# Patient Record
Sex: Male | Born: 1960 | Race: White | Hispanic: No | Marital: Married | Smoking: Current every day smoker
Health system: Southern US, Community
[De-identification: ages and names within clinical notes are randomized; demographics above are authoritative.]

## PROBLEM LIST (undated history)

## (undated) DIAGNOSIS — E669 Obesity, unspecified: Secondary | ICD-10-CM

## (undated) HISTORY — PX: ANKLE SURGERY: SHX546

## (undated) HISTORY — PX: HERNIA REPAIR: SHX51

## (undated) HISTORY — PX: SHOULDER SURGERY: SHX246

---

## 2013-10-29 ENCOUNTER — Ambulatory Visit: Payer: Self-pay | Admitting: Gastroenterology

## 2013-10-31 LAB — PATHOLOGY REPORT

## 2016-10-24 ENCOUNTER — Ambulatory Visit
Admission: EM | Admit: 2016-10-24 | Discharge: 2016-10-24 | Disposition: A | Discharge status: Home / Self Care | Payer: BLUE CROSS/BLUE SHIELD | Attending: Family Medicine | Admitting: Family Medicine

## 2016-10-24 ENCOUNTER — Ambulatory Visit (INDEPENDENT_AMBULATORY_CARE_PROVIDER_SITE_OTHER): Payer: BLUE CROSS/BLUE SHIELD

## 2016-10-24 ENCOUNTER — Encounter: Payer: Self-pay | Admitting: *Deleted

## 2016-10-24 DIAGNOSIS — S9032XA Contusion of left foot, initial encounter: Secondary | ICD-10-CM

## 2016-10-24 NOTE — ED Triage Notes (Signed)
Pt was cutting a tree Saturday and had a tree fall on his left foot. Left foot now painful and edematous. He has been icing and elevating left foot with improvement in swelling but pain continues.

## 2016-10-24 NOTE — ED Provider Notes (Signed)
CSN: [id number]     Arrival date & time 10/24/16  0850 History   First MD Initiated Contact with Patient 10/24/16 [phone]     Chief Complaint  Patient presents with  . Foot Pain   (Consider location/radiation/quality/duration/timing/severity/associated sxs/prior Treatment) HPI  56 year old male who was cutting down a tree on Saturday 2 days prior to this visit when a tree fell on his left foot when it inadvertently shifted. These fortunately he was on soft ground. Since that time his left foot is now painful and edematous with most of his pain over the mid forefoot. Icing and elevating it with some improvement in his swelling but the pain continues.      History reviewed. No pertinent past medical history. History reviewed. No pertinent surgical history. Family History  Problem Relation Age of Onset  . Hypertension Mother   . Hypertension Father   . CAD Father    Social History  Substance Use Topics  . Smoking status: Current Every Day Smoker  . Smokeless tobacco: Never Used  . Alcohol use Yes    Review of Systems  Constitutional: Positive for activity change. Negative for chills, fatigue and fever.  Musculoskeletal: Positive for gait problem and joint swelling.  All other systems reviewed and are negative.   Allergies  Patient has no known allergies.  Home Medications   Prior to Admission medications   Not on File   Meds Ordered and Administered this Visit  Medications - No data to display  BP (!) 152/79 (BP Location: Left Arm)   Pulse 79   Temp 98 F (36.7 C) (Oral)   Resp 16   Ht 6' (1.829 m)   Wt 295 lb (133.8 kg)   SpO2 98%   BMI 40.01 kg/m  No data found.   Physical Exam  Constitutional: He appears well-developed and well-nourished. No distress.  HENT:  Head: Normocephalic and atraumatic.  Eyes: Pupils are equal, round, and reactive to light.  Neck: Normal range of motion. Neck supple.  Musculoskeletal:  Admission of left foot shows a swelling of  the fourth dorsum. Maximum tenderness is over the third and fourth metatarsals shafts. Junction of the distal and middle thirds. There is no pitting edema present.  Skin: He is not diaphoretic.  Nursing note and vitals reviewed.   Urgent Care Course     Procedures (including critical care time)  Labs Review Labs Reviewed - No data to display  Imaging Review Dg Foot Complete Left  Result Date: 10/24/2016 CLINICAL DATA:  Crush injury to LEFT foot while cutting trees, pain at top of LEFT foot toward 2nd-4th MTP joints EXAM: LEFT FOOT - COMPLETE 3+ VIEW COMPARISON:  None FINDINGS: Osseous mineralization normal. Dorsal soft tissue swelling overlying the distal metatarsal level. Small plantar calcaneal spur. No acute fracture, dislocation, or bone destruction. IMPRESSION: No acute osseous abnormalities. Electronically Signed   By: Ulyses SouthwardMark  Boles M.D.   On: 10/24/2016 10:04     Visual Acuity Review  Right Eye Distance:   Left Eye Distance:   Bilateral Distance:    Right Eye Near:   Left Eye Near:    Bilateral Near:         MDM   1. Contusion of left foot, initial encounter    Patient should take keep his foot elevated and iced minutes every 2 hours to 3 times daily. He states he is not having difficulty walking but as the pressure on top of his foot that is the most bothersome. Wrapped  this with an Ace wrap. I have told him that a hard sole shoe is probably more comfortable for him. If he continues to have problems he may return to our clinic or should follow-up with a podiatrist.Use Motrin for pain.    Lutricia Feil, PA-C 10/24/16 1025

## 2017-12-15 ENCOUNTER — Ambulatory Visit
Admission: EM | Admit: 2017-12-15 | Discharge: 2017-12-15 | Disposition: A | Discharge status: Home / Self Care | Payer: BLUE CROSS/BLUE SHIELD | Attending: Emergency Medicine | Admitting: Emergency Medicine

## 2017-12-15 ENCOUNTER — Other Ambulatory Visit: Payer: Self-pay

## 2017-12-15 DIAGNOSIS — K047 Periapical abscess without sinus: Secondary | ICD-10-CM

## 2017-12-15 DIAGNOSIS — K05219 Aggressive periodontitis, localized, unspecified severity: Secondary | ICD-10-CM | POA: Diagnosis not present

## 2017-12-15 HISTORY — DX: Obesity, unspecified: E66.9

## 2017-12-15 MED ORDER — AMOXICILLIN-POT CLAVULANATE 875-125 MG PO TABS: 1.0000 | ORAL_TABLET | Freq: Two times a day (BID) | ORAL | 0 refills | Status: DC

## 2017-12-15 NOTE — ED Triage Notes (Addendum)
Pt with recent sinus sx and then broke a tooth on Wednesday night. Now with pain, swelling to chin, jaw and right face. Pain 7/10

## 2017-12-15 NOTE — Discharge Instructions (Addendum)
Finish the antibiotic Augmentin, continue saline nasal irrigation, stop Claritin, start Mucinex.  Continue salt water gargles.  600 mg ibuprofen with 1 g of Tylenol 3-4 times a day as needed for pain.  Here is a list of primary care providers who are taking new patients:  Dr. Elizabeth Sauer, Dr. Schuyler Amor [address] [place] [address] [phone]  [facility] [address] [place] [address]  [phone]  [facility] [address] [place], [place] [address] [phone]  [facility] [address] [place]  [phone] [place], [place] [address]  Here are clinics/ other resources who will see you if you do not have insurance. Some have certain criteria that you must meet. Call them and find out what they are:  [facility]: [address] [place], [place] [address] Phone: [phone] Hours: First and Third Saturdays of each Month, 9 a.m. - 1 p.m.  [facility]: [address], [address] [place], [place] [address] Phone: [phone] Hours: Tuesday, 4 p.m. - 8 p.m. Thursday, 1 p.m. - 8 p.m. Wednesday, 9 a.m. - [facility] [address], [place], [place] [address] Phone: [phone] Pharmacy Phone Number: [phone] Dental Phone Number: [phone] [facility] Insurance Help: [phone]  Dental Hours: Monday - Thursday, 8 a.m. - 6 p.m.  [facility] [address], [place], [place] [address] Phone: [phone] Pharmacy Phone Number: [phone] [facility] Insurance Help: [phone]  [facility] [address] [place] [place], [place] [address] Phone: [phone] Pharmacy Phone Number: [phone] [facility] Insurance Help: [phone]  [facility] [address] [place], [place] [address] Phone: [phone] [facility] Insurance Help: [phone]   [facility] [address] [place], [place] [address] Phone:  [phone]  Go to www.goodrx.com to look up your medications. This will give you a list of where you can find your prescriptions at the most affordable prices. Or ask the pharmacist what the cash price is, or if they have any other discount programs available to help make your medication more affordable. This can be less expensive than what you would pay with insurance.

## 2017-12-15 NOTE — ED Provider Notes (Signed)
HPI  SUBJECTIVE:  Alexander Hines is a 57 y.o. male who presents with throbbing, dull, constant jaw pain and swelling under his lower lip after chi[pping a right lower tooth 3 days ago.  He denies swelling under his tongue, neck stiffness.  States he has an appointment with his dentist on Monday.  No antibiotics in the past month.  He took ibuprofen within 6 to 8 hours of evaluation.  No fevers.  Denies any tooth pain.  States that he has been taking ibuprofen 400 to 600 mg every 4 hours, doing salt water gargles, Listerine and applying an ice pack to his face.  Symptoms are better with the ice pack.  No aggravating factors.  It is not associated with eating, drinking, exposure to air.  Patient also reports having a "sinus infection" nasal congestion, sinus pain pressure, rhinorrhea, postnasal drip past several days.  He denies fevers.  He has tried saline nasal irrigation and Claritin for this.  Past medical history negative for diabetes, hypertension.  PMD: None.  Dentistry: Dr. Shawna Clamp    Past Medical History:  Diagnosis Date  . Obese     Past Surgical History:  Procedure Laterality Date  . ANKLE SURGERY    . HERNIA REPAIR    . SHOULDER SURGERY      Family History  Problem Relation Age of Onset  . Hypertension Mother   . Hypertension Father   . CAD Father     Social History   Tobacco Use  . Smoking status: Current Every Day Smoker    Packs/day: 0.25    Types: Cigarettes  . Smokeless tobacco: Never Used  Substance Use Topics  . Alcohol use: Yes    Comment: rare  . Drug use: Never    No current facility-administered medications for this encounter.   Current Outpatient Medications:  .  amoxicillin-clavulanate (AUGMENTIN) 875-125 MG tablet, Take 1 tablet by mouth 2 (two) times daily. X 7 days, Disp: 14 tablet, Rfl: 0  Allergies  Allergen Reactions  . Penicillins Diarrhea     ROS  As noted in HPI.   Physical Exam  BP (!) 149/81 (BP Location: Left Arm)   Pulse 70    Temp 98.1 F (36.7 C) (Oral)   Resp 18   Ht  (1.854 m)   Wt 300 lb (136.1 kg)   SpO2 96%   BMI 39.58 kg/m   Constitutional: Well developed, well nourished, no acute distress Eyes:  EOMI, conjunctiva normal bilaterally HENT: Normocephalic, atraumatic,mucus membranes moist.  Tooth #27 cuspid missing, nontender.  Root intact.  Positive swelling with fluctuance along the gingiva.  No swelling, induration underneath the tongue.  No swelling inferior to the jaw. Lymph: No cervical lymphadenopathy Respiratory: Normal inspiratory effort Cardiovascular: Normal rate GI: nondistended skin: No rash, skin intact Musculoskeletal: no deformities Neurologic: Alert & oriented x 3, no focal neuro deficits Psychiatric: Speech and behavior appropriate   ED Course   Medications - No data to display  No orders of the defined types were placed in this encounter.   No results found for this or any previous visit (from the past 24 hour(s)). No results found.  ED Clinical Impression  Dental abscess  Gingival abscess   ED Assessment/Plan  Patient declined I&D.  Suspect dental with resulting gingival abscess.  No evidence of Ludwig angina.  States that he will follow-up with his dentist on Monday.  In the meantime, Augmentin, continue saline nasal irrigation, stop Claritin, start Mucinex.  Continue salt  water gargles.  600 mg ibuprofen with 1 g of Tylenol 3-4 times a day as needed for pain.  Will also provide a primary care list for routine care.  States that he has taken amoxicillin in the past without any problem  Discussed MDM, treatment plan, and plan for follow-up with patient.  patient agrees with plan.   Meds ordered this encounter  Medications  . amoxicillin-clavulanate (AUGMENTIN) 875-125 MG tablet    Sig: Take 1 tablet by mouth 2 (two) times daily. X 7 days    Dispense:  14 tablet    Refill:  0    *This clinic note was created using Scientist, clinical (histocompatibility and immunogenetics). Therefore,  there may be occasional mistakes despite careful proofreading.   ?   Domenick Gong, MD 12/17/17 [phone]

## 2021-12-26 ENCOUNTER — Telehealth: Payer: 59 | Admitting: Family Medicine

## 2021-12-26 DIAGNOSIS — S81811A Laceration without foreign body, right lower leg, initial encounter: Secondary | ICD-10-CM | POA: Diagnosis not present

## 2021-12-26 DIAGNOSIS — L03115 Cellulitis of right lower limb: Secondary | ICD-10-CM

## 2021-12-26 MED ORDER — CEPHALEXIN 500 MG PO CAPS: 500.0000 mg | ORAL_CAPSULE | Freq: Four times a day (QID) | ORAL | 0 refills | Status: AC

## 2021-12-26 NOTE — Progress Notes (Signed)
?Virtual Visit Consent  ? ?Alexander Hines, you are scheduled for a virtual visit with a [facility] provider today. Just as with appointments in the office, your consent must be obtained to participate. Your consent will be active for this visit and any virtual visit you may have with one of our providers in the next 365 days. If you have a MyChart account, a copy of this consent can be sent to you electronically. ? ?As this is a virtual visit, video technology does not allow for your provider to perform a traditional examination. This may limit your provider's ability to fully assess your condition. If your provider identifies any concerns that need to be evaluated in person or the need to arrange testing (such as labs, EKG, etc.), we will make arrangements to do so. Although advances in technology are sophisticated, we cannot ensure that it will always work on either your end or our end. If the connection with a video visit is poor, the visit may have to be switched to a telephone visit. With either a video or telephone visit, we are not always able to ensure that we have a secure connection. ? ?By engaging in this virtual visit, you consent to the provision of healthcare and authorize for your insurance to be billed (if applicable) for the services provided during this visit. Depending on your insurance coverage, you may receive a charge related to this service. ? ?I need to obtain your verbal consent now. Are you willing to proceed with your visit today? [name] has provided verbal consent on 12/26/2021 for a virtual visit (video or telephone). Georgana Curio, FNP ? ?Date: 12/26/2021 5:44 PM ? ?Virtual Visit via Video Note  ? ?IGeorgana Curio, connected with  DARIS HARKINS  ([id number], 06-Jul-1961) on 12/26/21 at  5:30 PM EDT by a video-enabled telemedicine application and verified that I am speaking with the correct person using two identifiers. ? ?Location: ?Patient: Virtual Visit Location Patient:  Home ?Provider: Virtual Visit Location Provider: Home Office ?  ?I discussed the limitations of evaluation and management by telemedicine and the availability of in person appointments. The patient expressed understanding and agreed to proceed.   ? ?History of Present Illness: ?Alexander Hines is a 61 y.o. who identifies as a male who was assigned male at birth, and is being seen today for redness, swelling with laceration to RLE from wood 2 days ago. He was cutting wood. He says he ran a fever last night and woke up with a red leg. He is no diabetes. His TDAp is no up to date. He has no history of DVT. He is in no distress.  ? ?HPI: HPI  ?Problems: There are no problems to display for this patient. ?  ?Allergies:  ?Allergies  ?Allergen Reactions  ? Penicillins Diarrhea  ? ?Medications:  ?Current Outpatient Medications:  ?  amoxicillin-clavulanate (AUGMENTIN) 875-125 MG tablet, Take 1 tablet by mouth 2 (two) times daily. X 7 days, Disp: 14 tablet, Rfl: 0 ? ?Observations/Objective: ?Patient is well-developed, well-nourished in no acute distress.  ?Resting comfortably  at home.  ?Head is normocephalic, atraumatic.  ?No labored breathing.  ?Speech is clear and coherent with logical content.  ?Patient is alert and oriented at baseline.  ? ? ?Assessment and Plan: ?1. Cellulitis of right lower leg ? ?2. Laceration of right lower leg, initial encounter ? ?Elevate leg, med use and side effects discussed, he says he is able to take cephalosporins with pcn allergy  being diarrhea. Proceed to his pcp tomorrow [facility] for updated tdap and recheck on leg. ED if sx worsen.Keep laceration clean and dry. It is scabbed over.  ? ?Follow Up Instructions: ?I discussed the assessment and treatment plan with the patient. The patient was provided an opportunity to ask questions and all were answered. The patient agreed with the plan and demonstrated an understanding of the instructions.  A copy of instructions were sent to the  patient via MyChart unless otherwise noted below.  ? ? ? ?The patient was advised to call back or seek an in-person evaluation if the symptoms worsen or if the condition fails to improve as anticipated. ? ?Time:  ?I spent 10 minutes with the patient via telehealth technology discussing the above problems/concerns.   ? ?Georgana Curio, FNP  ?

## 2021-12-26 NOTE — Patient Instructions (Signed)
Laceration Care, Adult ?A laceration is a cut that may go through all layers of the skin and into the tissue that is right under the skin. Some lacerations heal on their own. Others need to be closed with stitches (sutures), staples, skin adhesive strips, or skin glue. ?Proper care of a laceration reduces the risk for infection, helps the laceration heal better, and may prevent scarring. ?General tips ?Keep the wound clean and dry. ?Do not scratch or pick at the wound. ?Wash your hands with soap and water for at least 20 seconds before and after touching your wound or changing your bandage (dressing). If soap and water are not available, use hand sanitizer. ?Do not usedisinfectants or antiseptics, such as rubbing alcohol, to clean your wound unless told by your health care provider. ?If you were given a dressing, you should change it at least once a day, or as told by your health care provider. You should also change it if it becomes wet or dirty. ?How to care for your laceration ?If sutures or staples were used: ?Keep the wound completely dry for the first 24 hours, or as told by your health care provider. After that time, you may shower or bathe. Do not soak your wound in water until after the sutures or staples have been removed. ?Clean the wound once each day, or as told by your health care provider. To do this: ?Wash the wound with soap and water. ?Rinse the wound with water to remove all soap. ?Pat the wound dry with a clean towel. Do not rub the wound. ?After cleaning the wound, apply a thin layer of antibiotic ointment, other topical ointments, or a non-adherent dressing as told by your health care provider. This will help prevent infection and keep the dressing from sticking to the wound. ?Have the sutures or staples removed as told by your health care provider. Do not  remove sutures or staples yourself. ?If skin adhesive strips were used: ?Do not get the skin adhesive strips wet. You may shower or bathe,  but keep the wound dry. ?If the wound gets wet, pat it dry with a clean towel. Do not rub the wound. ?Skin adhesive strips fall off on their own. If adhesive strip edges start to loosen and curl up, you may trim the loose edges. Do not remove adhesive strips completely unless your health care provider tells you to do that. ?If skin glue was used: ?You may shower or bathe, but try to keep the wound dry. Do not soak the wound in water. ?After showering or bathing, pat the wound dry with a clean towel. Do not rub the wound. ?Do not do any activities that will make you sweat a lot until the skin glue has fallen off. ?Do not apply liquid, cream, or ointment medicine to the wound while the skin glue is in place. Doing this may loosen the film before the wound has healed. ?If a dressing is placed over the wound, do not apply tape directly over the skin glue. Doing this may cause the glue to be pulled off before the wound has healed. ?Do not pick at the glue. Skin glue usually remains in place for 5-10 days and then falls off the skin. ?Follow these instructions at home: ?Medicines ?Take over-the-counter and prescription medicines only as told by your health care provider. ?If you were prescribed an antibiotic medicine or ointment, take or apply it as told by your health care provider. Do not stop using it even if   your condition improves. ?Managing pain and swelling ?If directed, put ice on the injured area. To do this: ?Put ice in a plastic bag. ?Place a towel between your skin and the bag. ?Leave the ice on for 20 minutes, 2-3 times a day. ?Remove the ice if your skin turns bright red. This is very important. If you cannot feel pain, heat, or cold, you have a greater risk of damage to the area. ?Raise (elevate) the injured area above the level of your heart while you are sitting or lying down for the first 24-48 hours after the laceration is repaired. ?General instructions ? ?Avoid any activity that could cause your wound  to reopen. ?Check your wound every day for signs of infection. Watch for: ?More redness, swelling, or pain. ?Fluid or blood. ?Warmth. ?Pus or a bad smell. ?Keep all follow-up visits. This is important. ?Contact a health care provider if: ?You received a tetanus shot and you have swelling, severe pain, redness, or bleeding at the injection site. ?Your closed wound breaks open. ?You have any of these signs of infection: ?More redness, swelling, or pain around your wound. ?Fluid or blood coming from your wound. ?Warmth coming from your wound. ?Pus or a bad smell coming from your wound. ?A fever. ?You notice something coming out of the wound, such as wood or glass. ?Your pain is not controlled with medicine. ?You notice a change in the color of your skin near your wound. ?You need to change the dressing often. ?You develop a new rash. ?You have numbness around the wound. ?Get help right away if: ?You develop severe swelling around the wound. ?Your pain suddenly increases and is severe. ?You develop painful lumps near the wound or on skin anywhere else on your body. ?You have a red streak going away from your wound. ?The wound is on your hand or foot, and you cannot properly move a finger or toe. ?The wound is on your hand or foot, and you notice that your fingers or toes look pale or bluish. ?Summary ?A laceration is a cut that may go through all layers of the skin and into the tissue that is right under the skin. ?Some lacerations heal on their own. Others need to be closed with stitches (sutures), staples, skin adhesive strips, or skin glue. ?Proper care of a laceration reduces the risk of infection, helps the laceration heal better, and may prevent scarring. ?This information is not intended to replace advice given to you by your health care provider. Make sure you discuss any questions you have with your health care provider. ?Document Revised: 10/08/2020 Document Reviewed: 10/08/2020 ?Elsevier Patient Education ?  2023 [employer] ?Cellulitis, Adult ? ?Cellulitis is a skin infection. The infected area is usually warm, red, swollen, and tender. This condition occurs most often in the arms and lower legs. The infection can travel to the muscles, blood, and underlying tissue and become serious. It is very important to get treated for this condition. ?What are the causes? ?Cellulitis is caused by bacteria. The bacteria enter through a break in the skin, such as a cut, burn, insect bite, open sore, or crack. ?What increases the risk? ?This condition is more likely to occur in people who: ?Have a weak body defense system (immune system). ?Have open wounds on the skin, such as cuts, burns, bites, and scrapes. Bacteria can enter the body through these open wounds. ?Are older than 61 years of age. ?Have diabetes. ?Have a type of long-lasting (chronic) liver disease (  cirrhosis) or kidney disease. ?Are obese. ?Have a skin condition such as: ?Itchy rash (eczema). ?Slow movement of blood in the veins (venous stasis). ?Fluid buildup below the skin (edema). ?Have had radiation therapy. ?Use IV drugs. ?What are the signs or symptoms? ?Symptoms of this condition include: ?Redness, streaking, or spotting on the skin. ?Swollen area of the skin. ?Tenderness or pain when an area of the skin is touched. ?Warm skin. ?A fever. ?Chills. ?Blisters. ?How is this diagnosed? ?This condition is diagnosed based on a medical history and physical exam. You may also have tests, including: ?Blood tests. ?Imaging tests. ?How is this treated? ?Treatment for this condition may include: ?Medicines, such as antibiotic medicines or medicines to treat allergies (antihistamines). ?Supportive care, such as rest and application of cold or warm cloths (compresses) to the skin. ?Hospital care, if the condition is severe. ?The infection usually starts to get better within 1-2 days of treatment. ?Follow these instructions at home: ? ?Medicines ?Take over-the-counter and  prescription medicines only as told by your health care provider. ?If you were prescribed an antibiotic medicine, take it as told by your health care provider. Do not stop taking the antibiotic even if

## 2022-01-07 ENCOUNTER — Emergency Department
Admission: EM | Admit: 2022-01-07 | Discharge: 2022-01-07 | Disposition: A | Discharge status: Home / Self Care | Payer: 59 | Attending: Emergency Medicine | Admitting: Emergency Medicine

## 2022-01-07 ENCOUNTER — Emergency Department: Payer: 59

## 2022-01-07 ENCOUNTER — Other Ambulatory Visit: Payer: Self-pay

## 2022-01-07 DIAGNOSIS — L03115 Cellulitis of right lower limb: Secondary | ICD-10-CM | POA: Diagnosis not present

## 2022-01-07 DIAGNOSIS — M79661 Pain in right lower leg: Secondary | ICD-10-CM | POA: Diagnosis present

## 2022-01-07 DIAGNOSIS — D72829 Elevated white blood cell count, unspecified: Secondary | ICD-10-CM | POA: Diagnosis not present

## 2022-01-07 LAB — CBC WITH DIFFERENTIAL/PLATELET
Abs Immature Granulocytes: 0.06 10*3/uL (ref 0.00–0.07)
Basophils Absolute: 0.1 10*3/uL (ref 0.0–0.1)
Basophils Relative: 0 %
Eosinophils Absolute: 0.1 10*3/uL (ref 0.0–0.5)
Eosinophils Relative: 1 %
HCT: 46.4 % (ref 39.0–52.0)
Hemoglobin: 15.5 g/dL (ref 13.0–17.0)
Immature Granulocytes: 1 %
Lymphocytes Relative: 26 %
Lymphs Abs: 3 10*3/uL (ref 0.7–4.0)
MCH: 29.5 pg (ref 26.0–34.0)
MCHC: 33.4 g/dL (ref 30.0–36.0)
MCV: 88.2 fL (ref 80.0–100.0)
Monocytes Absolute: 0.6 10*3/uL (ref 0.1–1.0)
Monocytes Relative: 5 %
Neutro Abs: 7.8 10*3/uL — ABNORMAL HIGH (ref 1.7–7.7)
Neutrophils Relative %: 67 %
Platelets: 302 10*3/uL (ref 150–400)
RBC: 5.26 MIL/uL (ref 4.22–5.81)
RDW: 12.5 % (ref 11.5–15.5)
WBC: 11.5 10*3/uL — ABNORMAL HIGH (ref 4.0–10.5)
nRBC: 0 % (ref 0.0–0.2)

## 2022-01-07 LAB — COMPREHENSIVE METABOLIC PANEL
ALT: 31 U/L (ref 0–44)
AST: 22 U/L (ref 15–41)
Albumin: 3.8 g/dL (ref 3.5–5.0)
Alkaline Phosphatase: 63 U/L (ref 38–126)
Anion gap: 11 (ref 5–15)
BUN: 12 mg/dL (ref 8–23)
CO2: 24 mmol/L (ref 22–32)
Calcium: 9 mg/dL (ref 8.9–10.3)
Chloride: 103 mmol/L (ref 98–111)
Creatinine, Ser: 0.87 mg/dL (ref 0.61–1.24)
GFR, Estimated: >60 mL/min (ref >60)
Glucose, Bld: 157 mg/dL — ABNORMAL HIGH (ref 70–99)
Potassium: 3.8 mmol/L (ref 3.5–5.1)
Sodium: 138 mmol/L (ref 135–145)
Total Bilirubin: 0.8 mg/dL (ref 0.3–1.2)
Total Protein: 7.5 g/dL (ref 6.5–8.1)

## 2022-01-07 MED ORDER — DEXTROSE 5 % IV SOLN
1500.0000 mg | Freq: Once | INTRAVENOUS | Status: AC
  Administered 2022-01-07: 1500 mg via INTRAVENOUS
  Filled 2022-01-07: qty 75

## 2022-01-07 NOTE — ED Triage Notes (Signed)
Pt presents to ED with c/o of R lower leg cellulitis, pt being TX'ed with keflex for 10 days with minimal relief. Pt denies fevers or chills. Pt denies red area getting any bigger.

## 2022-01-07 NOTE — ED Notes (Signed)
Received report from Katelyn RN.

## 2022-01-07 NOTE — Discharge Instructions (Signed)
Keep the leg elevated, do not apply Epsom salt, alcohol or other substances to the skin.  Return to the ER for new, worsening, or persistent severe redness, swelling, pain or redness going up the leg, fever, or any other new or worsening symptoms that concern you.

## 2022-01-07 NOTE — ED Provider Notes (Signed)
[facility] Provider Note    Event Date/Time   First MD Initiated Contact with Patient 01/07/22 1526     (approximate)   History   Cellulitis   HPI  Alexander Hines is a 61 y.o. male with no active medical problems who presents with persistent redness and pain to the anterior right lower leg after being treated for the last 10 days with Keflex for what was thought to be cellulitis.  The patient sustained a laceration to the right lower leg from a piece of wood around 5/12.  He had a virtual visit on 5/14 and was prescribed Keflex at that time for likely cellulitis of the right lower leg.  He states that it initially improved on the first 1 to 2 days, but then has remained stable and subsequently somewhat worsened over the last several days.  He denies any pain going up the leg.  He had a fever the first night but has not had any fever or chills since that time.  He denies any vomiting or diarrhea.    Physical Exam   Triage Vital Signs: ED Triage Vitals [01/07/22 1334]  Enc Vitals Group     BP (!) 158/87     Pulse Rate 95     Resp 17     Temp 98.1 F (36.7 C)     Temp Source Oral     SpO2 96 %     Weight      Height      Head Circumference      Peak Flow      Pain Score 1     Pain Loc      Pain Edu?      Excl. in GC?     Most recent vital signs: Vitals:   01/07/22 1334 01/07/22 1833  BP: (!) 158/87 (!) 161/76  Pulse: 95 78  Resp: 17 19  Temp: 98.1 F (36.7 C) (!) 97.4 F (36.3 C)  SpO2: 96% 95%    General: Alert and oriented, well-appearing. CV:  Good peripheral perfusion.  Resp:  Normal effort.  Abd:  No distention.  Other:  Anterior right lower leg with approximately 10 x 15 cm area of erythema, induration, and slightly increased warmth compared to adjacent skin with no generalized edema to the leg and no calf or popliteal tenderness.  There is no fluctuance or mass.  There are no open lesions or drainage.   ED Results /  Procedures / Treatments   Labs (all labs ordered are listed, but only abnormal results are displayed) Labs Reviewed  CBC WITH DIFFERENTIAL/PLATELET - Abnormal; Notable for the following components:      Result Value   WBC 11.5 (*)    Neutro Abs 7.8 (*)    All other components within normal limits  COMPREHENSIVE METABOLIC PANEL - Abnormal; Notable for the following components:   Glucose, Bld 157 (*)    All other components within normal limits     EKG     RADIOLOGY  US venous RLE: I independently viewed and interpreted the images; there is no evidence of acute DVT  PROCEDURES:  Critical Care performed: No  Procedures   MEDICATIONS ORDERED IN ED: Medications  dalbavancin (DALVANCE) 1,500 mg in dextrose 5 % 500 mL IVPB (0 mg Intravenous Stopped 01/07/22 1822)     IMPRESSION / MDM / ASSESSMENT AND PLAN / ED COURSE  I reviewed the triage vital signs and the nursing notes.  60 year old male  with PMH as noted above presents with persistent redness and induration to the right lower leg after finishing a course of Keflex.  Differential diagnosis includes, but is not limited to, continued cellulitis, dependent edema, lymphadenitis, venous stasis.  The leg is slightly swollen compared to the right but I have a lower suspicion for DVT.  Lab work-up is overall reassuring.  The patient has mildly elevated WBC count but normal electrolytes and no other significant findings.  Patient's presentation is most consistent with acute complicated illness / injury requiring diagnostic workup.    This patient is a good candidate for treatment with dalbavancin given that he has 1 SIRS criteria (pulse greater than 90) and borderline elevated WBC count but no other evidence of systemic infection or sepsis.  He has failed treatment with oral antibiotics and I would consider him for admission for IV antibiotics, however he is overall stable and I think would do well at home.  The patient is  agreeable with this plan.  ----------------------------------------- 7:36 PM on 01/07/2022 -----------------------------------------  The patient has received the Dalvance and the ultrasound is negative for acute DVT.  He is stable for discharge at this time.  Return precautions given, and he expresses understanding.   FINAL CLINICAL IMPRESSION(S) / ED DIAGNOSES   Final diagnoses:  Cellulitis of right lower extremity     Rx / DC Orders   ED Discharge Orders          Ordered    Ambulatory referral to Infectious Disease       Comments: Cellulitis patient:  Received dalbavancin on 01/07/2022.   01/07/22 1640             Note:  This document was prepared using Dragon voice recognition software and may include unintentional dictation errors.    Dionne Bucy, MD 01/07/22 [phone]

## 2022-01-07 NOTE — ED Notes (Signed)
Pt alert and oriented, speaking in full sentences, breathing easy and unlabored, cleared for discharge.

## 2022-01-17 ENCOUNTER — Encounter: Payer: Self-pay | Admitting: Infectious Diseases

## 2022-01-17 ENCOUNTER — Other Ambulatory Visit: Payer: Self-pay

## 2022-01-17 ENCOUNTER — Ambulatory Visit: Payer: 59 | Admitting: Infectious Diseases

## 2022-01-17 VITALS — BP 170/83 | HR 70 | Temp 97.5°F | Ht 72.0 in | Wt 300.0 lb

## 2022-01-17 DIAGNOSIS — F17200 Nicotine dependence, unspecified, uncomplicated: Secondary | ICD-10-CM | POA: Diagnosis not present

## 2022-01-17 DIAGNOSIS — L03115 Cellulitis of right lower limb: Secondary | ICD-10-CM | POA: Insufficient documentation

## 2022-01-17 DIAGNOSIS — M7989 Other specified soft tissue disorders: Secondary | ICD-10-CM

## 2022-01-17 MED ORDER — DOXYCYCLINE HYCLATE 100 MG PO TABS: 100.0000 mg | ORAL_TABLET | Freq: Two times a day (BID) | ORAL | 0 refills | Status: AC

## 2022-01-17 NOTE — Progress Notes (Addendum)
There are no problems to display for this patient.  No current outpatient medications on file prior to visit.   No current facility-administered medications on file prior to visit.    Subjective: 61 Y O male with Morbid obesity and smoking who is referred from ED for rt leg cellulitis.  He had a scratch in his rt posterior leg while cleaning his garage from ? wood on 5/12. This was followed by fevers and chills the following day and redness of his rt leg 2 days after. He had a Virtual visit on  5/14 and was given a 10 days course of cephalexin QID which he took as instructed without missing doses. He came to ED 5/26 as he still had redness following completion of abtx course. Received one dose of dalbavacin. Duplex of rt leg 5/26 was negative for DVT.   Accompanied by family member. Says redness of rt leg has significantly improved. Denies fevers, chills, sweats. Denies pain, wounds. Denies nausea, vomiting and diarrhea. However, he complains of persistent swelling in the rt leg.   Has 2 dogs, 1 cat and 5 horses. He is retired. Smokes 1 pack of cigarettes in 1.5 days, denies alcohol and IVDU.  Denies any known PMH or being on medications.    Review of Systems: ROS all systems reviewed with pertinent positives and negatives as listed above  Past Medical History:  Diagnosis Date   Obese    Past Surgical History:  Procedure Laterality Date   ANKLE SURGERY     HERNIA REPAIR     SHOULDER SURGERY      Social History   Tobacco Use   Smoking status: Every Day    Packs/day: 0.25    Types: Cigarettes   Smokeless tobacco: Never  Vaping Use   Vaping Use: Some days  Substance Use Topics   Alcohol use: Yes    Comment: rare   Drug use: Never    Family History  Problem Relation Age of Onset   Hypertension Mother    Hypertension Father    CAD Father     Allergies  Allergen Reactions   Penicillins Diarrhea    Health Maintenance  Topic Date Due   COVID-19  Vaccine (1) Never done   HIV Screening  Never done   Hepatitis C Screening  Never done   TETANUS/TDAP  Never done   COLONOSCOPY (Pts 45-42yrs Insurance coverage will need to be confirmed)  Never done   Zoster Vaccines- Shingrix (1 of 2) Never done   INFLUENZA VACCINE  03/15/2022   HPV VACCINES  Aged Out    Objective: BP (!) 170/83   Pulse 70   Temp (!) 97.5 F (36.4 C) (Oral)   Ht 6' (1.829 m)   Wt 300 lb (136.1 kg)   SpO2 96%   BMI 40.69 kg/m   Physical Exam Constitutional:      Appearance: Normal appearance. Morbidly obese  HENT:     Head: Normocephalic and atraumatic.      Mouth: Mucous membranes are moist.  Eyes:    Conjunctiva/sclera: Conjunctivae normal.     Pupils:  Cardiovascular:     Rate and Rhythm: Normal rate and regular rhythm.     Heart sounds:  Pulmonary:     Effort: Pulmonary effort is normal.     Breath sounds: Normal breath sounds.   Abdominal:     General: Non distended     Palpations: soft. Obese abdomen  Musculoskeletal:        General: Normal range of motion.   Rt leg - swollen, mild tenderness noted in the rt anterior leg, no erythema and warmth   Skin:    General: Skin is warm and dry.     Comments:  Neurological:     General: grossly non focal     Mental Status: awake, alert and oriented to person, place, and time.   Psychiatric:        Mood and Affect: Mood normal.   Lab Results Lab Results  Component Value Date   WBC 11.5 (H) 01/07/2022   HGB 15.5 01/07/2022   HCT 46.4 01/07/2022   MCV 88.2 01/07/2022   PLT 302 01/07/2022    Lab Results  Component Value Date   CREATININE 0.87 01/07/2022   BUN 12 01/07/2022   NA 138 01/07/2022   K 3.8 01/07/2022   CL 103 01/07/2022   CO2 24 01/07/2022    Lab Results  Component Value Date   ALT 31 01/07/2022   AST 22 01/07/2022   ALKPHOS 63 01/07/2022   BILITOT 0.8 01/07/2022    No results found for: CHOL, HDL, LDLCALC, LDLDIRECT, TRIG, CHOLHDL No results found for:  LABRPR, RPRTITER No results found for: HIV1RNAQUANT, HIV1RNAVL, CD4TABS   Patient Active Problem List   Diagnosis Date Noted   Cellulitis of right lower extremity 01/17/2022   Right leg swelling 01/17/2022   Smoking 01/17/2022   Morbid obesity ([place]) 01/17/2022   Assessment/Plan # Rt leg cellulitis # Rt leg swelling  Doxycycline 100mg  po bid for 7 days given mild tenderness noted in rt anterior leg Elevate leg  Fu as needed. Discussed signs and symptoms to seek urgent evaluatio  # Smoking # Obesity  Would benefit from cutting down smoking and healthy diet and exercise.    I have personally spent 62  minutes involved in face-to-face and non-face-to-face activities for this patient on the day of the visit. Professional time spent includes the following activities: Preparing to see the patient (review of tests), Obtaining and/or reviewing separately obtained history (admission/discharge record), Performing a medically appropriate examination and/or evaluation , Ordering medications/tests/procedures, referring and communicating with other health care professionals, Documenting clinical information in the EMR, Independently interpreting results (not separately reported), Communicating results to the patient/family/caregiver, Counseling and educating the patient/family/caregiver and Care coordination (not separately reported).   Wilber Oliphant, [facility] 01/17/2022, 8:26 AM

## 2023-07-29 IMAGING — US US EXTREM LOW VENOUS*R*
1 series · 14 of 24 positions shown · non-contrast
Comparison: None Available.

CLINICAL DATA: Swelling right lower extremity

EXAM:
Right LOWER EXTREMITY VENOUS DOPPLER ULTRASOUND
TECHNIQUE: Gray-scale sonography with compression, as well as color and duplex
ultrasound, were performed to evaluate the deep venous system(s)
from the level of the common femoral vein through the popliteal and
proximal calf veins.

[Series 1: us venous img lower uni right (dvt) · portal-venous · 14 of 38 slices shown]
[im 1/38]
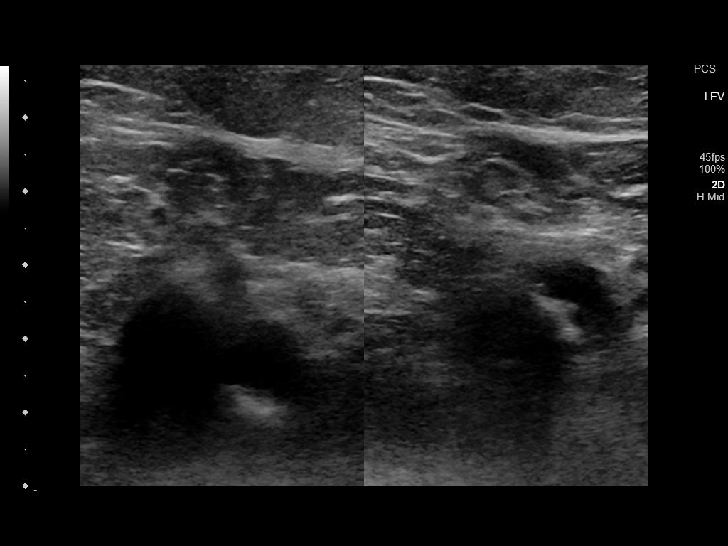
[im 4/38]
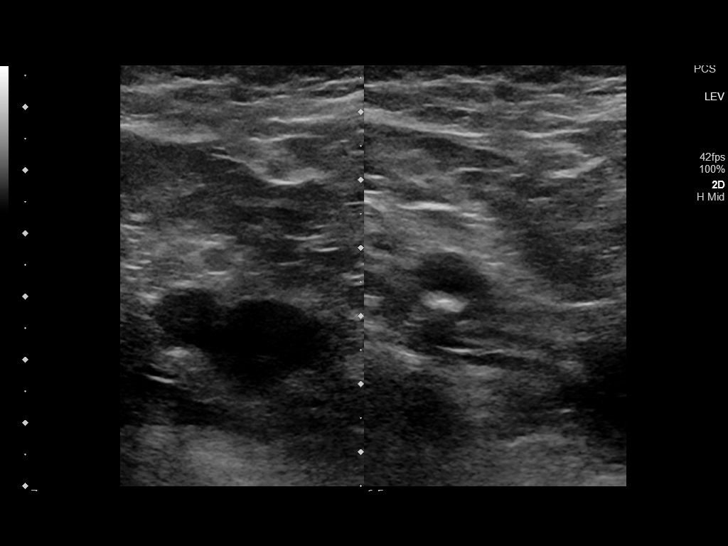
[im 7/38]
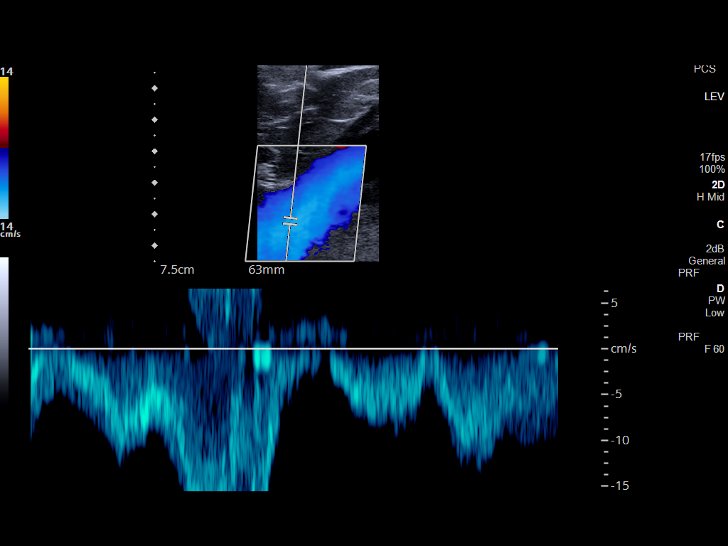
[im 10/38]
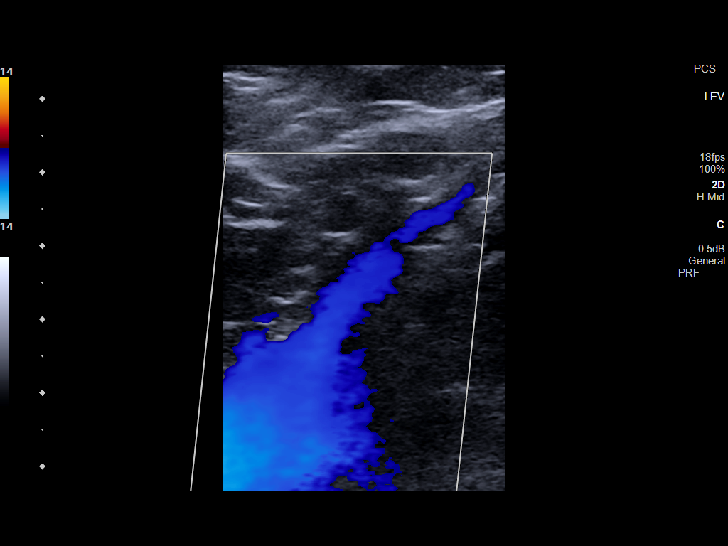
[im 12/38]
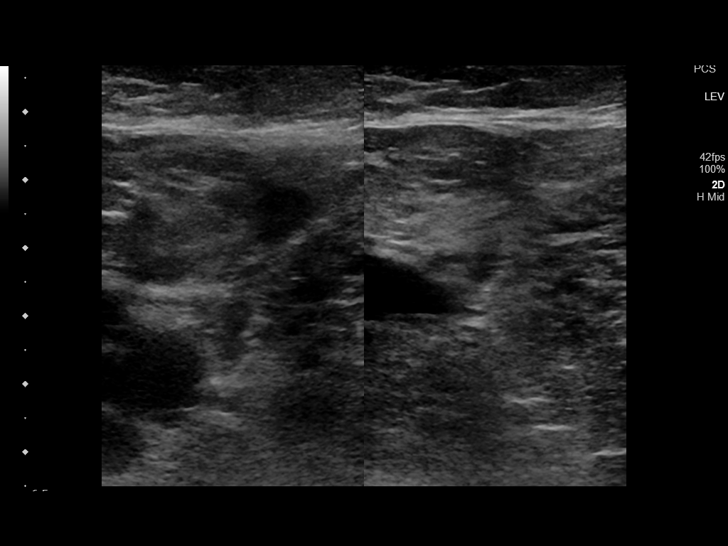
[im 15/38]
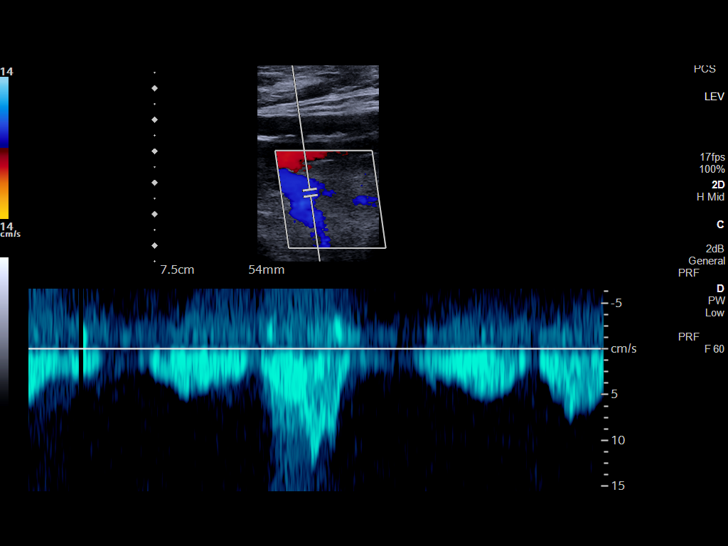
[im 18/38]
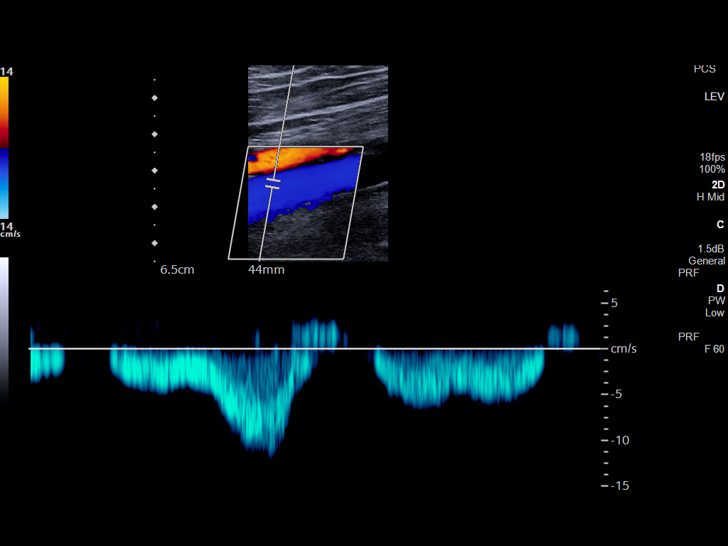
[im 20/38]
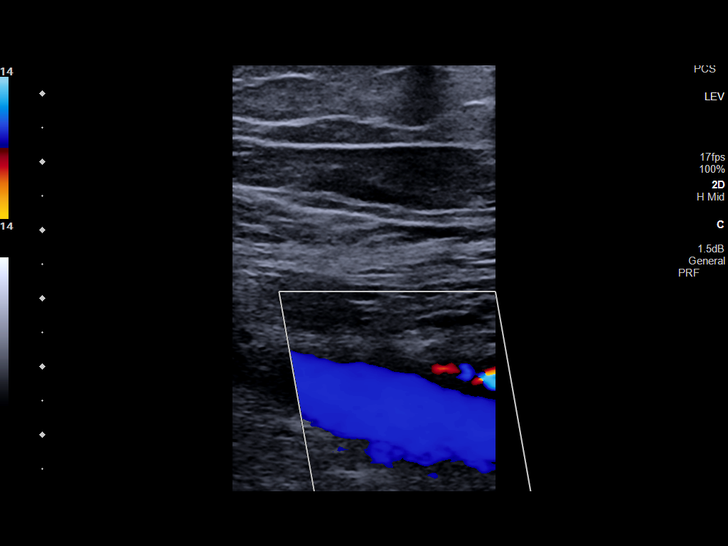
[im 23/38]
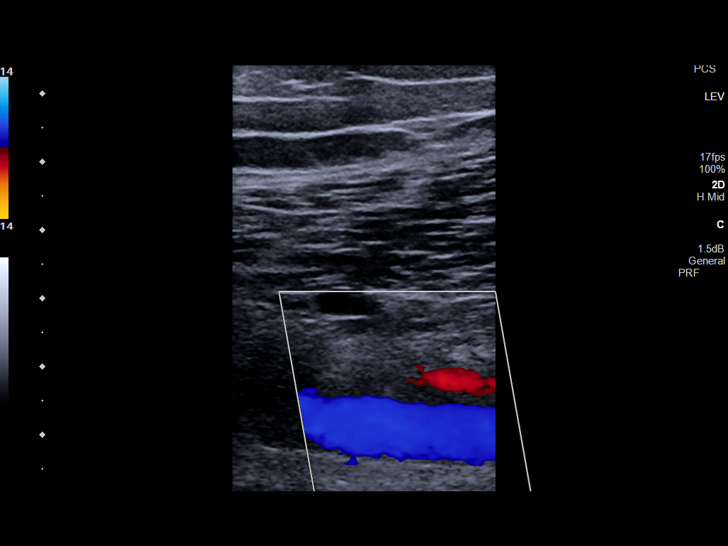
[im 26/38]
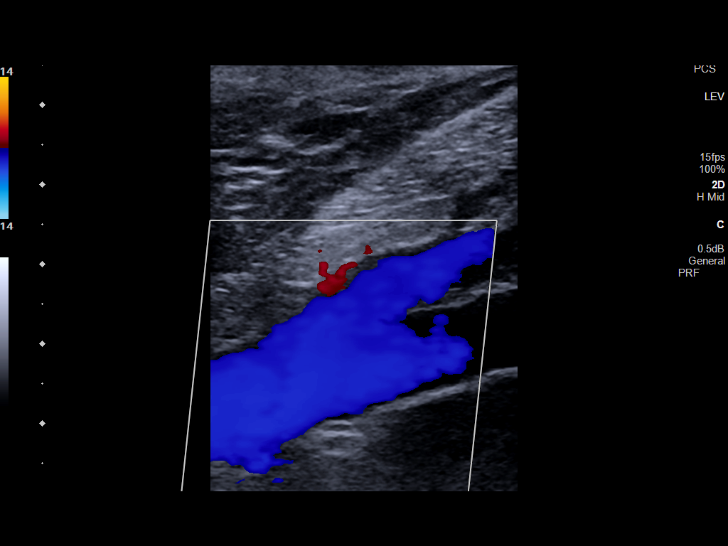
[im 29/38]
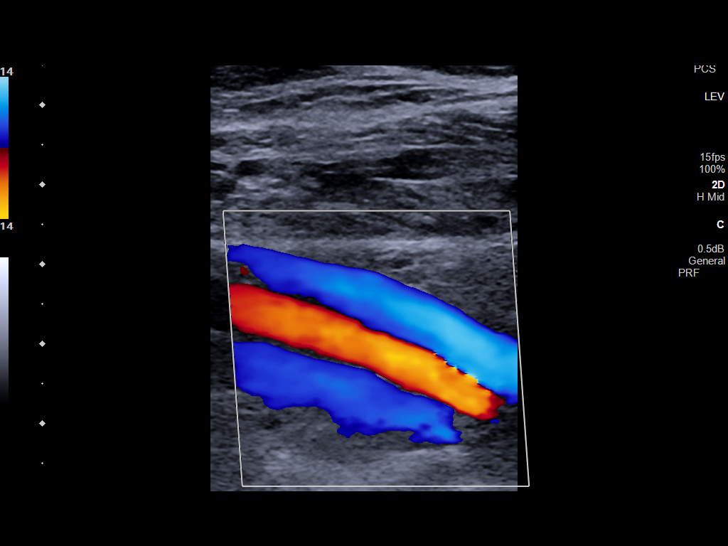
[im 31/38]
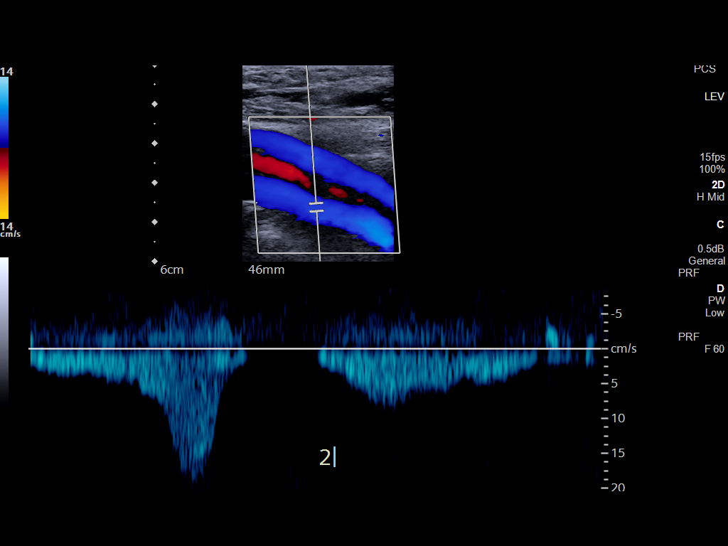
[im 34/38]
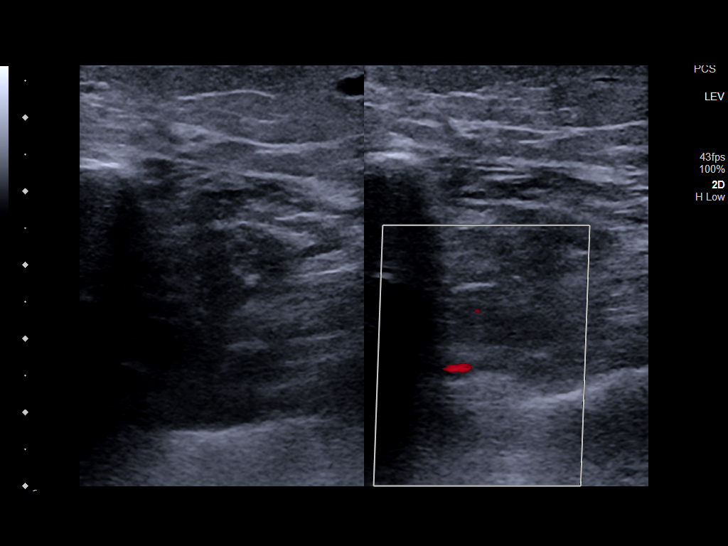
[im 38/38]
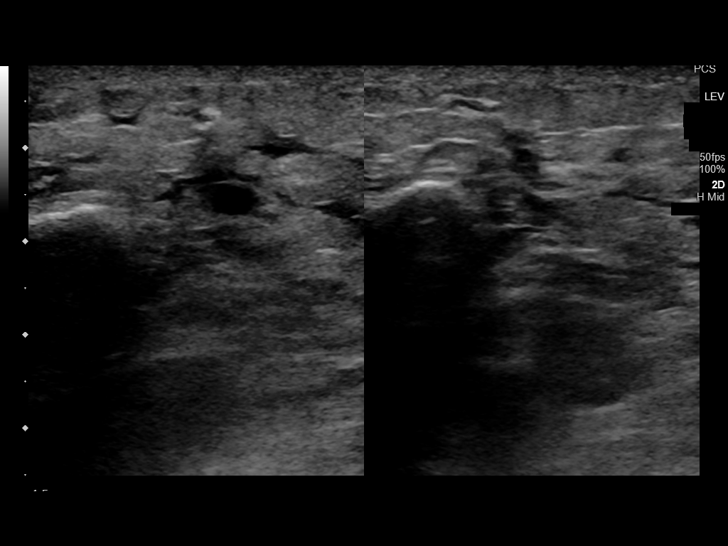

[14 of 24 positions shown; findings below may reference images not displayed]

FINDINGS: VENOUS

Normal compressibility of the common femoral, superficial femoral,
and popliteal veins, as well as the visualized calf veins.
Visualized portions of profunda femoral vein and great saphenous
vein unremarkable. No filling defects to suggest DVT on grayscale or
color Doppler imaging. Doppler waveforms show normal direction of
venous flow, normal respiratory plasticity and response to
augmentation.

Limited views of the contralateral common femoral vein are
unremarkable.

OTHER

There is edema in the subcutaneous plane without any loculated fluid
collections in the right calf.

Limitations: none
IMPRESSION: There is no evidence of deep venous thrombosis in the right lower
extremity.
